# Patient Record
Sex: Female | Born: 2000
Health system: Southern US, Community
[De-identification: ages and names within clinical notes are randomized; demographics above are authoritative.]

## PROBLEM LIST (undated history)

## (undated) ENCOUNTER — Emergency Department (HOSPITAL_COMMUNITY): Admission: EM | Payer: 59 | Source: Home / Self Care

## (undated) DIAGNOSIS — G90A Postural orthostatic tachycardia syndrome (POTS): Secondary | ICD-10-CM

## (undated) DIAGNOSIS — G901 Familial dysautonomia [Riley-Day]: Secondary | ICD-10-CM

## (undated) DIAGNOSIS — I498 Other specified cardiac arrhythmias: Secondary | ICD-10-CM

## (undated) DIAGNOSIS — J45909 Unspecified asthma, uncomplicated: Secondary | ICD-10-CM

## (undated) DIAGNOSIS — F419 Anxiety disorder, unspecified: Secondary | ICD-10-CM

## (undated) DIAGNOSIS — F909 Attention-deficit hyperactivity disorder, unspecified type: Secondary | ICD-10-CM

## (undated) HISTORY — DX: Anxiety disorder, unspecified: F41.9

## (undated) HISTORY — DX: Attention-deficit hyperactivity disorder, unspecified type: F90.9

## (undated) HISTORY — PX: WISDOM TOOTH EXTRACTION: SHX21

---
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
## 2021-01-31 DIAGNOSIS — G901 Familial dysautonomia [Riley-Day]: Secondary | ICD-10-CM | POA: Insufficient documentation

## 2021-02-03 ENCOUNTER — Encounter: Payer: Self-pay | Admitting: Internal Medicine

## 2021-02-03 ENCOUNTER — Ambulatory Visit: Payer: BC Managed Care – PPO | Admitting: Internal Medicine

## 2021-02-03 ENCOUNTER — Other Ambulatory Visit: Payer: Self-pay

## 2021-02-03 VITALS — Ht 72.0 in | Wt 178.2 lb

## 2021-02-03 DIAGNOSIS — G901 Familial dysautonomia [Riley-Day]: Secondary | ICD-10-CM

## 2021-02-03 DIAGNOSIS — R002 Palpitations: Secondary | ICD-10-CM

## 2021-02-03 MED ORDER — ATENOLOL 25 MG PO TABS: ORAL_TABLET | ORAL | 3 refills | Status: DC

## 2021-02-03 NOTE — Progress Notes (Signed)
Patient Care Team: Creola Corn, MD as PCP - General (Internal Medicine) Meriam Sprague, MD as PCP - Cardiology (Cardiology)   HPI  Mary Townsend is a 20 y.o. female who goes by Mary Townsend Seen in follow-up for dysautonomic symptoms in the setting of arachnodactyly and anxiety.  Long history of post exercise lightheadedness and shower intolerance.  Worsened over the summer and then again 9/21 with COVID  with exacerbations of her dysautonomia Including worsening shower intolerances lightheadedness.  Working on salt water repletion and at last visit gave her beta-blockers for tachypalpitations particularly but not solely with exertion.  Seems also to be fueled by anxiety as she noted her heart rate going up on her apple watch     She continues to study at [employer] and made the Dean's List.   However, about a month ago she had Mono. She is recovering and reports not being back to baseline. Generally she was feeling well before becoming ill. Her POTS has been exacerbated by having Mono. Her heart rate  noticeably elevated and she could feel the palpitations. She continues to have lightheadedness getting out of bed, and does not tolerate showers well. Her school work has been mostly affected by lower energy and anxiety.  On her Apple watch her heart rate has been as high as 120 bpm while sitting. She notes she only wears her watch once a week or less because wearing it produces anxiety concerning her heart rate. When she is feeling severely anxious she will take hydroxyzine as needed.  She exercises at the gym regularly. This includes lifting weights, walks on the incline and StairMaster machines withh post exercise LH . Compared to September she is exercising the same amount.     About 2 weeks ago she was started on trazodone to help her sleep, which has been very helpful.  The patient denies chest pain, nocturnal dyspnea, orthopnea or peripheral edema.  There has been no  syncope.  DATE TEST EF   9/21 Echo  55-60 %          Date Cr K Hgb  5/22 0.72 3.7 13.0         Records and Results Reviewed   Past Medical History:  Diagnosis Date   Anxiety    Asthma    COVID 10/2019   Dysautonomia (HCC)    POTS (postural orthostatic tachycardia syndrome)     Past Surgical History:  Procedure Laterality Date   WISDOM TOOTH EXTRACTION      Current Meds  Medication Sig   atenolol (TENORMIN) 25 MG tablet Take 1 tablet (25 mg total) by mouth 2 (two) times daily.   clindamycin (CLEOCIN) 300 MG capsule Take 1 capsule (300 mg total) by mouth 3 (three) times daily.   gabapentin (NEURONTIN) 600 MG tablet Take 600 mg by mouth 3 (three) times daily.    hydrOXYzine (VISTARIL) 25 MG capsule Take 50 mg by mouth 2 (two) times daily as needed.   ibuprofen (ADVIL) 600 MG tablet Take 1 tablet (600 mg total) by mouth every 6 (six) hours as needed.   venlafaxine XR (EFFEXOR-XR) 150 MG 24 hr capsule Take 150 mg by mouth daily.   venlafaxine XR (EFFEXOR-XR) 75 MG 24 hr capsule Take 1 capsule by mouth daily.    Allergies  Allergen Reactions   Amoxicillin Hives      Review of Systems negative except from HPI and PMH  Physical Exam Ht 6' (1.829 m)    Wt  178 lb 3.2 oz (80.8 kg)    SpO2 98%    BMI 24.17 kg/m  Well developed and nourished in no acute distress HENT normal Neck supple with JVP-  flat  Clear Regular rate and rhythm, no murmurs or gallops Abd-soft with active BS No Clubbing cyanosis edema Skin-warm and dry A & Oriented  Grossly normal sensory and motor function  ECG sinus at 84 Intervals 17/09/35      CrCl cannot be calculated (Patient's most recent lab result is older than the maximum 21 days allowed.).   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   Most bothersome thing has been the palpitations as well as the fatigue.  She is recently started on trazodone which is helping her sleep and hopefully will help her fatigue.  We will try to increase her atenolol from 25 twice daily--50/25.  She will let us know next week or so how she is feeling.  She did a great job with her academics.  Encouraged salt water repletion and exercise.        I,Mathew Stumpf,acting as a scribe for Sherryl Manges, MD.,have documented all relevant documentation on the behalf of Sherryl Manges, MD,as directed by  Sherryl Manges, MD while in the presence of Sherryl Manges, MD.  I, Sherryl Manges, MD, have reviewed all documentation for this visit. The documentation on 02/03/21 for the exam, diagnosis, procedures, and orders are all accurate and complete.

## 2021-02-03 NOTE — Patient Instructions (Signed)
Medication Instructions:  Your physician has recommended you make the following change in your medication:  Increase your Atenolol to 50mg  - (2 tablets by mouth in the am) and 25mg  (1 tablet by mouth in the pm.)  *If you need a refill on your cardiac medications before your next appointment, please call your pharmacy*   Lab Work: None ordered.  If you have labs (blood work) drawn today and your tests are completely normal, you will receive your results only by: MyChart Message (if you have MyChart) OR A paper copy in the mail If you have any lab test that is abnormal or we need to change your treatment, we will call you to review the results.   Testing/Procedures: None ordered.    Follow-Up: At [facility], you and your health needs are our priority.  As part of our continuing mission to provide you with exceptional heart care, we have created designated Provider Care Teams.  These Care Teams include your primary Cardiologist (physician) and Advanced Practice Providers (APPs -  Physician Assistants and Nurse Practitioners) who all work together to provide you with the care you need, when you need it.  We recommend signing up for the patient portal called "MyChart".  Sign up information is provided on this After Visit Summary.  MyChart is used to connect with patients for Virtual Visits (Telemedicine).  Patients are able to view lab/test results, encounter notes, upcoming appointments, etc.  Non-urgent messages can be sent to your provider as well.   To learn more about what you can do with MyChart, go to .    Your next appointment:   6 months with Dr [facility]

## 2021-02-06 DIAGNOSIS — N644 Mastodynia: Secondary | ICD-10-CM | POA: Diagnosis not present

## 2021-02-06 DIAGNOSIS — N898 Other specified noninflammatory disorders of vagina: Secondary | ICD-10-CM | POA: Diagnosis not present

## 2021-02-06 DIAGNOSIS — R300 Dysuria: Secondary | ICD-10-CM | POA: Diagnosis not present

## 2021-02-07 ENCOUNTER — Telehealth: Payer: Self-pay | Admitting: Cardiology

## 2021-02-07 MED ORDER — ATENOLOL 25 MG PO TABS: ORAL_TABLET | ORAL | 3 refills | Status: AC

## 2021-02-07 NOTE — Telephone Encounter (Signed)
°*  STAT* If patient is at the pharmacy, call can be transferred to refill team.   1. Which medications need to be refilled? (please list name of each medication and dose if known) atenolol (TENORMIN) 25 MG tablet  2. Which pharmacy/location (including street and city if local pharmacy) is medication to be sent to?[facility] - [place], [place] - [address]   3. Do they need a 30 day or 90 day supply? 90 ds

## 2021-02-27 DIAGNOSIS — Z880 Allergy status to penicillin: Secondary | ICD-10-CM | POA: Diagnosis not present

## 2021-02-27 DIAGNOSIS — R42 Dizziness and giddiness: Secondary | ICD-10-CM | POA: Diagnosis not present

## 2021-02-27 DIAGNOSIS — R410 Disorientation, unspecified: Secondary | ICD-10-CM | POA: Diagnosis not present

## 2021-02-27 DIAGNOSIS — F419 Anxiety disorder, unspecified: Secondary | ICD-10-CM | POA: Diagnosis not present

## 2021-03-03 DIAGNOSIS — I498 Other specified cardiac arrhythmias: Secondary | ICD-10-CM | POA: Diagnosis not present

## 2021-05-09 DIAGNOSIS — M79602 Pain in left arm: Secondary | ICD-10-CM | POA: Diagnosis not present

## 2021-05-09 DIAGNOSIS — R0789 Other chest pain: Secondary | ICD-10-CM | POA: Diagnosis not present

## 2021-05-09 DIAGNOSIS — Z79899 Other long term (current) drug therapy: Secondary | ICD-10-CM | POA: Diagnosis not present

## 2021-05-09 DIAGNOSIS — R079 Chest pain, unspecified: Secondary | ICD-10-CM | POA: Diagnosis not present

## 2021-05-09 DIAGNOSIS — F419 Anxiety disorder, unspecified: Secondary | ICD-10-CM | POA: Diagnosis not present

## 2021-05-11 DIAGNOSIS — W01198A Fall on same level from slipping, tripping and stumbling with subsequent striking against other object, initial encounter: Secondary | ICD-10-CM | POA: Diagnosis not present

## 2021-05-11 DIAGNOSIS — M542 Cervicalgia: Secondary | ICD-10-CM | POA: Diagnosis not present

## 2021-05-12 DIAGNOSIS — M9901 Segmental and somatic dysfunction of cervical region: Secondary | ICD-10-CM | POA: Diagnosis not present

## 2021-05-12 DIAGNOSIS — M5412 Radiculopathy, cervical region: Secondary | ICD-10-CM | POA: Diagnosis not present

## 2021-05-13 DIAGNOSIS — M9901 Segmental and somatic dysfunction of cervical region: Secondary | ICD-10-CM | POA: Diagnosis not present

## 2021-05-13 DIAGNOSIS — M5412 Radiculopathy, cervical region: Secondary | ICD-10-CM | POA: Diagnosis not present

## 2021-05-31 DIAGNOSIS — R509 Fever, unspecified: Secondary | ICD-10-CM | POA: Diagnosis not present

## 2021-05-31 DIAGNOSIS — J029 Acute pharyngitis, unspecified: Secondary | ICD-10-CM | POA: Diagnosis not present

## 2021-05-31 DIAGNOSIS — Z8709 Personal history of other diseases of the respiratory system: Secondary | ICD-10-CM | POA: Diagnosis not present

## 2021-05-31 DIAGNOSIS — J069 Acute upper respiratory infection, unspecified: Secondary | ICD-10-CM | POA: Diagnosis not present

## 2021-06-08 ENCOUNTER — Encounter: Payer: Self-pay | Admitting: Internal Medicine

## 2021-06-08 ENCOUNTER — Ambulatory Visit: Payer: BC Managed Care – PPO | Admitting: Internal Medicine

## 2021-06-08 VITALS — BP 110/64 | HR 64 | Ht 72.0 in | Wt 179.0 lb

## 2021-06-08 DIAGNOSIS — G901 Familial dysautonomia [Riley-Day]: Secondary | ICD-10-CM | POA: Diagnosis not present

## 2021-06-08 NOTE — Progress Notes (Signed)
? ? ? ? ?Patient Care Team: ?Creola Corn, MD as PCP - General (Internal Medicine) ?Meriam Sprague, MD as PCP - Cardiology (Cardiology) ? ? ?HPI ? ?Mary Townsend is a 21 y.o. female who goes by HOWELL ?Seen in follow-up for dysautonomic symptoms in the setting of arachnodactyly and anxiety. ? ?Long history of post exercise lightheadedness and shower intolerance.  Worsened over the summer and then again 9/21 with COVID.  Developed recurrent COVID12/21 with exacerbations of her dysautonomia ?Including worsening shower intolerances lightheadedness.  Working on salt water repletion and at last visit gave her beta-blockers for tachypalpitations particularly but not solely with exertion.  Seems also to be fueled by anxiety as she noted her heart rate going up on her apple watch ? ?Tolerating the atenolol 25 daily is just going up to 50 mg a day.  Palpitations are better ? ? ?She had a great first year at [employer].  She made the Dean's list.  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  Some problems with dizziness.  Worse at the time of her periods she is disinclined towards hormonal suppression. ? ?Her anxiety is much better, depression is better. ? ?She and her mother have asked to have a referral to [facility] ? ?Records and Results Reviewed  ? ?Past Medical History:  ?Diagnosis Date  ? Anxiety   ? Asthma   ? COVID 10/2019  ? Dysautonomia (HCC)   ? POTS (postural orthostatic tachycardia syndrome)   ? ? ?Past Surgical History:  ?Procedure Laterality Date  ? WISDOM TOOTH EXTRACTION    ? ? ?Current Meds  ?Medication Sig  ? ALPRAZolam (XANAX) 0.25 MG tablet Take 0.125-0.25 mg by mouth daily as needed.  ? atenolol (TENORMIN) 25 MG tablet Take 50mg  (2 tablets) by mouth in the am and 25mg  (1 tablet) by mouth in the pm  ? clindamycin (CLEOCIN) 300 MG capsule Take 1 capsule (300 mg total) by mouth 3 (three) times daily.  ? gabapentin (NEURONTIN) 600 MG tablet Take 600 mg by mouth 3 (three) times daily.   ? hydrOXYzine  (VISTARIL) 25 MG capsule Take 50 mg by mouth 2 (two) times daily as needed.  ? sertraline (ZOLOFT) 100 MG tablet Take 100 mg by mouth daily.  ? traZODone (DESYREL) 50 MG tablet Take 25 mg by mouth at bedtime.  ? ? ?Allergies  ?Allergen Reactions  ? Amoxicillin Hives  ? ? ? ? ?Review of Systems negative except from HPI and PMH ? ?Physical Exam ?BP 110/64   Pulse 64   Ht 6' (1.829 m)   Wt 179 lb (81.2 kg)   SpO2 98%   BMI 24.28 kg/m?  ?Well developed and nourished in no acute distress ?HENT normal ?Neck supple with JVP-  flat   ?Clear ?Regular rate and rhythm, no murmurs or gallops ?Abd-soft with active BS ?No Clubbing cyanosis edema ?Skin-warm and dry ?A & Oriented  Grossly normal sensory and motor function ? ?ECG sinus @ 64 ?17/08/36   ? ?   ? ?CrCl cannot be calculated (Patient's most recent lab result is older than the maximum 21 days allowed.). ? ? ?Assessment and  Plan ?Dysautonomia with post Covid aggravation and repeat COVID ?  ?Anxiety ?  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  ?  ?Overall doing exceptionally well.  Handled the stresses of school with grade aplumb.  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  Thinks she will handle the heat okay. ?Anxiety is better. ? ?She would  like to have a referral to [facility].  We will look into this. ? ?

## 2021-06-08 NOTE — Addendum Note (Signed)
Addended by: Alois Cliche on: 06/08/2021 05:38 PM ? ? Modules accepted: Orders ? ?

## 2021-06-08 NOTE — Patient Instructions (Signed)
Medication Instructions:  Your physician recommends that you continue on your current medications as directed. Please refer to the Current Medication list given to you today.  *If you need a refill on your cardiac medications before your next appointment, please call your pharmacy*   Lab Work: None ordered.  If you have labs (blood work) drawn today and your tests are completely normal, you will receive your results only by: MyChart Message (if you have MyChart) OR A paper copy in the mail If you have any lab test that is abnormal or we need to change your treatment, we will call you to review the results.   Testing/Procedures: None ordered.    Follow-Up: At [facility], you and your health needs are our priority.  As part of our continuing mission to provide you with exceptional heart care, we have created designated Provider Care Teams.  These Care Teams include your primary Cardiologist (physician) and Advanced Practice Providers (APPs -  Physician Assistants and Nurse Practitioners) who all work together to provide you with the care you need, when you need it.  We recommend signing up for the patient portal called "MyChart".  Sign up information is provided on this After Visit Summary.  MyChart is used to connect with patients for Virtual Visits (Telemedicine).  Patients are able to view lab/test results, encounter notes, upcoming appointments, etc.  Non-urgent messages can be sent to your provider as well.   To learn more about what you can do with MyChart, go to [email or web address].    Your next appointment:    6 months with Dr Klein   Important Information About Sugar       

## 2021-07-16 ENCOUNTER — Encounter: Payer: Self-pay | Admitting: Internal Medicine

## 2021-08-02 DIAGNOSIS — R569 Unspecified convulsions: Secondary | ICD-10-CM | POA: Diagnosis not present

## 2021-08-02 DIAGNOSIS — R519 Headache, unspecified: Secondary | ICD-10-CM | POA: Diagnosis not present

## 2021-08-02 DIAGNOSIS — I10 Essential (primary) hypertension: Secondary | ICD-10-CM | POA: Diagnosis not present

## 2021-08-22 DIAGNOSIS — H9192 Unspecified hearing loss, left ear: Secondary | ICD-10-CM | POA: Diagnosis not present

## 2021-08-22 DIAGNOSIS — H7292 Unspecified perforation of tympanic membrane, left ear: Secondary | ICD-10-CM | POA: Diagnosis not present

## 2021-08-29 DIAGNOSIS — Z130 Encounter for screening for diseases of the blood and blood-forming organs and certain disorders involving the immune mechanism: Secondary | ICD-10-CM | POA: Diagnosis not present

## 2021-08-29 DIAGNOSIS — Z113 Encounter for screening for infections with a predominantly sexual mode of transmission: Secondary | ICD-10-CM | POA: Diagnosis not present

## 2021-08-29 DIAGNOSIS — Z01419 Encounter for gynecological examination (general) (routine) without abnormal findings: Secondary | ICD-10-CM | POA: Diagnosis not present

## 2021-08-29 DIAGNOSIS — Z1389 Encounter for screening for other disorder: Secondary | ICD-10-CM | POA: Diagnosis not present

## 2021-09-07 DIAGNOSIS — R7989 Other specified abnormal findings of blood chemistry: Secondary | ICD-10-CM | POA: Diagnosis not present

## 2021-09-07 DIAGNOSIS — E538 Deficiency of other specified B group vitamins: Secondary | ICD-10-CM | POA: Diagnosis not present

## 2021-09-07 DIAGNOSIS — F419 Anxiety disorder, unspecified: Secondary | ICD-10-CM | POA: Diagnosis not present

## 2021-09-18 DIAGNOSIS — Z1389 Encounter for screening for other disorder: Secondary | ICD-10-CM | POA: Diagnosis not present

## 2021-09-18 DIAGNOSIS — R82998 Other abnormal findings in urine: Secondary | ICD-10-CM | POA: Diagnosis not present

## 2021-09-18 DIAGNOSIS — Z1331 Encounter for screening for depression: Secondary | ICD-10-CM | POA: Diagnosis not present

## 2021-09-18 DIAGNOSIS — I498 Other specified cardiac arrhythmias: Secondary | ICD-10-CM | POA: Diagnosis not present

## 2021-09-18 DIAGNOSIS — Z0000 Encounter for general adult medical examination without abnormal findings: Secondary | ICD-10-CM | POA: Diagnosis not present

## 2021-09-19 DIAGNOSIS — H73891 Other specified disorders of tympanic membrane, right ear: Secondary | ICD-10-CM | POA: Diagnosis not present

## 2021-09-19 DIAGNOSIS — H6122 Impacted cerumen, left ear: Secondary | ICD-10-CM | POA: Diagnosis not present

## 2021-09-19 DIAGNOSIS — Z8669 Personal history of other diseases of the nervous system and sense organs: Secondary | ICD-10-CM | POA: Diagnosis not present

## 2021-10-19 DIAGNOSIS — Z20822 Contact with and (suspected) exposure to covid-19: Secondary | ICD-10-CM | POA: Diagnosis not present

## 2021-10-19 DIAGNOSIS — R52 Pain, unspecified: Secondary | ICD-10-CM | POA: Diagnosis not present

## 2021-10-19 DIAGNOSIS — R0981 Nasal congestion: Secondary | ICD-10-CM | POA: Diagnosis not present

## 2021-10-19 DIAGNOSIS — J069 Acute upper respiratory infection, unspecified: Secondary | ICD-10-CM | POA: Diagnosis not present

## 2021-11-16 DIAGNOSIS — F509 Eating disorder, unspecified: Secondary | ICD-10-CM | POA: Diagnosis not present

## 2021-11-16 DIAGNOSIS — Z79899 Other long term (current) drug therapy: Secondary | ICD-10-CM | POA: Diagnosis not present

## 2021-11-25 DIAGNOSIS — R300 Dysuria: Secondary | ICD-10-CM | POA: Diagnosis not present

## 2021-11-25 DIAGNOSIS — N898 Other specified noninflammatory disorders of vagina: Secondary | ICD-10-CM | POA: Diagnosis not present

## 2021-12-06 DIAGNOSIS — Z23 Encounter for immunization: Secondary | ICD-10-CM | POA: Diagnosis not present

## 2021-12-19 DIAGNOSIS — J069 Acute upper respiratory infection, unspecified: Secondary | ICD-10-CM | POA: Diagnosis not present

## 2021-12-29 DIAGNOSIS — B2790 Infectious mononucleosis, unspecified without complication: Secondary | ICD-10-CM | POA: Diagnosis not present

## 2021-12-29 DIAGNOSIS — J029 Acute pharyngitis, unspecified: Secondary | ICD-10-CM | POA: Diagnosis not present

## 2022-01-25 DIAGNOSIS — I498 Other specified cardiac arrhythmias: Secondary | ICD-10-CM | POA: Diagnosis not present

## 2022-01-25 DIAGNOSIS — Z1331 Encounter for screening for depression: Secondary | ICD-10-CM | POA: Diagnosis not present

## 2022-02-16 DIAGNOSIS — Z3202 Encounter for pregnancy test, result negative: Secondary | ICD-10-CM | POA: Diagnosis not present

## 2022-02-16 DIAGNOSIS — N12 Tubulo-interstitial nephritis, not specified as acute or chronic: Secondary | ICD-10-CM | POA: Diagnosis not present

## 2022-02-16 DIAGNOSIS — N3090 Cystitis, unspecified without hematuria: Secondary | ICD-10-CM | POA: Diagnosis not present

## 2022-02-16 DIAGNOSIS — M549 Dorsalgia, unspecified: Secondary | ICD-10-CM | POA: Diagnosis not present

## 2022-02-16 DIAGNOSIS — Z79899 Other long term (current) drug therapy: Secondary | ICD-10-CM | POA: Diagnosis not present

## 2022-02-16 DIAGNOSIS — R509 Fever, unspecified: Secondary | ICD-10-CM | POA: Diagnosis not present

## 2022-02-16 DIAGNOSIS — R1031 Right lower quadrant pain: Secondary | ICD-10-CM | POA: Diagnosis not present

## 2022-02-16 DIAGNOSIS — Z20822 Contact with and (suspected) exposure to covid-19: Secondary | ICD-10-CM | POA: Diagnosis not present

## 2022-03-23 DIAGNOSIS — M545 Low back pain, unspecified: Secondary | ICD-10-CM | POA: Diagnosis not present

## 2022-03-23 DIAGNOSIS — R102 Pelvic and perineal pain: Secondary | ICD-10-CM | POA: Diagnosis not present

## 2022-04-09 DIAGNOSIS — F419 Anxiety disorder, unspecified: Secondary | ICD-10-CM | POA: Diagnosis not present

## 2022-04-09 DIAGNOSIS — Z8659 Personal history of other mental and behavioral disorders: Secondary | ICD-10-CM | POA: Diagnosis not present

## 2022-04-09 DIAGNOSIS — R635 Abnormal weight gain: Secondary | ICD-10-CM | POA: Diagnosis not present

## 2022-04-28 DIAGNOSIS — R059 Cough, unspecified: Secondary | ICD-10-CM | POA: Diagnosis not present

## 2022-04-28 DIAGNOSIS — R5383 Other fatigue: Secondary | ICD-10-CM | POA: Diagnosis not present

## 2022-04-28 DIAGNOSIS — J208 Acute bronchitis due to other specified organisms: Secondary | ICD-10-CM | POA: Diagnosis not present

## 2022-05-21 DIAGNOSIS — M79671 Pain in right foot: Secondary | ICD-10-CM | POA: Diagnosis not present

## 2022-05-21 DIAGNOSIS — S90851A Superficial foreign body, right foot, initial encounter: Secondary | ICD-10-CM | POA: Diagnosis not present

## 2022-05-22 DIAGNOSIS — M79671 Pain in right foot: Secondary | ICD-10-CM | POA: Diagnosis not present

## 2022-05-22 DIAGNOSIS — S90851A Superficial foreign body, right foot, initial encounter: Secondary | ICD-10-CM | POA: Diagnosis not present

## 2022-05-23 DIAGNOSIS — M79671 Pain in right foot: Secondary | ICD-10-CM | POA: Diagnosis not present

## 2022-05-23 DIAGNOSIS — S90851S Superficial foreign body, right foot, sequela: Secondary | ICD-10-CM | POA: Diagnosis not present

## 2022-06-08 DIAGNOSIS — F419 Anxiety disorder, unspecified: Secondary | ICD-10-CM | POA: Diagnosis not present

## 2022-06-08 DIAGNOSIS — R0981 Nasal congestion: Secondary | ICD-10-CM | POA: Diagnosis not present

## 2022-06-08 DIAGNOSIS — J302 Other seasonal allergic rhinitis: Secondary | ICD-10-CM | POA: Diagnosis not present

## 2022-06-08 DIAGNOSIS — J45909 Unspecified asthma, uncomplicated: Secondary | ICD-10-CM | POA: Diagnosis not present

## 2022-06-28 ENCOUNTER — Ambulatory Visit: Payer: BC Managed Care – PPO | Admitting: Podiatry

## 2022-06-28 ENCOUNTER — Encounter: Payer: Self-pay | Admitting: Podiatry

## 2022-06-28 ENCOUNTER — Ambulatory Visit (INDEPENDENT_AMBULATORY_CARE_PROVIDER_SITE_OTHER): Payer: BC Managed Care – PPO

## 2022-06-28 DIAGNOSIS — L923 Foreign body granuloma of the skin and subcutaneous tissue: Secondary | ICD-10-CM | POA: Diagnosis not present

## 2022-06-28 DIAGNOSIS — M7751 Other enthesopathy of right foot: Secondary | ICD-10-CM

## 2022-06-28 DIAGNOSIS — M79671 Pain in right foot: Secondary | ICD-10-CM

## 2022-06-29 NOTE — Progress Notes (Signed)
Subjective:   Patient ID: Mary Townsend, female   DOB: 22 y.o.   MRN: [id number]   HPI Patient presents with quite a bit of inflammation in the right foot where she stepped on a piece of glass around a month ago.  She did have it worked on and they thought they remove the glass but the area has been sore and it is not actually hurting completely right on the spot but more diffuse around the mask metatarsal phalangeal joints 234.  Patient does not smoke likes to be active   Review of Systems  All other systems reviewed and are negative.       Objective:  Physical Exam Vitals and nursing note reviewed.  Constitutional:      Appearance: She is well-developed.  Pulmonary:     Effort: Pulmonary effort is normal.  Musculoskeletal:        General: Normal range of motion.  Skin:    General: Skin is warm.  Neurological:     Mental Status: She is alert.     Neurovascular status found to be intact muscle strength found to be adequate range of motion within normal limits.  Patient is noted to have inflammation and pain around the second third fourth MPJs and a small area where there was a obvious small incision made with removal of foreign body.  It is crusted over there is no erythema edema or drainage associated with it     Assessment:  Probability that there is no glass but cannot be ruled out with strong possibility that an inflammatory complex is developed secondary to the injury     Plan:  H&P reviewed at great length x-rays taken inconclusive.  I did open the area up I may have been able to get out a small bit of foreign body but nothing of significance and I flushed the area applied sterile dressing with cushioning.  I then applied air fracture walker to remove all plantar pressure on the foot and I would like to put her on a steroid to try to reduce the inflammation but she is having test done this weekend so I want a wait till those are completed on Monday.  Also I would include  an antibiotic in case there is any kind of subtle infection process not identifiable.  Patient is also going out to the country on June 9 so we have a short timetable to try to improve her condition  X-rays indicate that there is no signs currently of foreign body or other bone or soft tissue pathology

## 2022-07-05 ENCOUNTER — Encounter: Payer: Self-pay | Admitting: Podiatry

## 2022-07-05 MED ORDER — METHYLPREDNISOLONE 4 MG PO TBPK: ORAL_TABLET | ORAL | 0 refills | Status: AC

## 2022-07-06 ENCOUNTER — Other Ambulatory Visit: Payer: Self-pay | Admitting: Podiatry

## 2022-07-06 DIAGNOSIS — G90A Postural orthostatic tachycardia syndrome (POTS): Secondary | ICD-10-CM | POA: Diagnosis not present

## 2022-07-06 DIAGNOSIS — M7751 Other enthesopathy of right foot: Secondary | ICD-10-CM

## 2022-07-06 DIAGNOSIS — M79671 Pain in right foot: Secondary | ICD-10-CM

## 2022-07-06 DIAGNOSIS — R232 Flushing: Secondary | ICD-10-CM | POA: Diagnosis not present

## 2022-07-06 DIAGNOSIS — L923 Foreign body granuloma of the skin and subcutaneous tissue: Secondary | ICD-10-CM

## 2022-07-06 DIAGNOSIS — G909 Disorder of the autonomic nervous system, unspecified: Secondary | ICD-10-CM | POA: Diagnosis not present

## 2022-07-06 DIAGNOSIS — R42 Dizziness and giddiness: Secondary | ICD-10-CM | POA: Diagnosis not present

## 2022-07-06 DIAGNOSIS — R000 Tachycardia, unspecified: Secondary | ICD-10-CM | POA: Diagnosis not present

## 2022-08-28 ENCOUNTER — Other Ambulatory Visit: Payer: Self-pay | Admitting: Podiatry

## 2022-08-28 ENCOUNTER — Encounter: Payer: Self-pay | Admitting: Podiatry

## 2022-08-28 MED ORDER — DOXYCYCLINE HYCLATE 100 MG PO TABS: 100.0000 mg | ORAL_TABLET | Freq: Two times a day (BID) | ORAL | 0 refills | Status: AC

## 2022-09-11 DIAGNOSIS — R635 Abnormal weight gain: Secondary | ICD-10-CM | POA: Diagnosis not present

## 2022-09-17 ENCOUNTER — Ambulatory Visit: Payer: BC Managed Care – PPO | Admitting: Podiatry

## 2022-09-19 DIAGNOSIS — Z124 Encounter for screening for malignant neoplasm of cervix: Secondary | ICD-10-CM | POA: Diagnosis not present

## 2022-09-19 DIAGNOSIS — R1032 Left lower quadrant pain: Secondary | ICD-10-CM | POA: Diagnosis not present

## 2022-09-19 DIAGNOSIS — Z01419 Encounter for gynecological examination (general) (routine) without abnormal findings: Secondary | ICD-10-CM | POA: Diagnosis not present

## 2022-09-24 ENCOUNTER — Ambulatory Visit: Payer: BC Managed Care – PPO | Admitting: Podiatry

## 2022-09-24 ENCOUNTER — Encounter: Payer: Self-pay | Admitting: Podiatry

## 2022-09-24 DIAGNOSIS — M722 Plantar fascial fibromatosis: Secondary | ICD-10-CM

## 2022-09-24 MED ORDER — TRIAMCINOLONE ACETONIDE 10 MG/ML IJ SUSP
10.0000 mg | Freq: Once | INTRAMUSCULAR | Status: AC
Start: 2022-09-24 — End: 2022-09-24
  Administered 2022-09-24: 10 mg via INTRA_ARTICULAR

## 2022-09-24 MED ORDER — DICLOFENAC SODIUM 75 MG PO TBEC: 75.0000 mg | DELAYED_RELEASE_TABLET | Freq: Two times a day (BID) | ORAL | 2 refills | Status: AC

## 2022-09-24 NOTE — Progress Notes (Signed)
Subjective:   Patient ID: Mary Townsend, female   DOB: 22 y.o.   MRN: [id number]   HPI Patient presents stating that she is having quite a bit of pain in her right heel and shin and the area that we worked on before is done well and patient is training to do a half marathon   ROS      Objective:  Physical Exam  Neurovascular status intact with patient having relative cavus foot structure with equinus condition and does have discomfort in the medial fascial band right at the insertional point tendon calcaneus with the patient noted to have shin pain along the dorsal shin     Assessment:  Appears to be mechanical related to inflammatory condition with patient being quite active     Plan:  H&P reviewed with her and father and at this point I did do sterile prep and I injected the plantar fascia at insertion 3 mg Kenalog 5 mg Xylocaine and we casted for orthotics to try to take pressure off the arch and stress.  Patient may require other treatments I also placed on an anti-inflammatory for the next several weeks of Voltaren twice daily.

## 2022-09-25 NOTE — Progress Notes (Signed)
Scanned Impressions today  Per Dr Charlsie Merles we can ship orthotics with break in schedule to patient at [facility]  Will need to get address as patient did not know at time of appointment  Addison Bailey Cped, CFo, CFm

## 2022-09-28 ENCOUNTER — Encounter: Payer: Self-pay | Admitting: Podiatry

## 2022-10-15 ENCOUNTER — Encounter: Payer: Self-pay | Admitting: Podiatry

## 2022-10-25 DIAGNOSIS — R0981 Nasal congestion: Secondary | ICD-10-CM | POA: Diagnosis not present

## 2022-11-22 DIAGNOSIS — R079 Chest pain, unspecified: Secondary | ICD-10-CM | POA: Diagnosis not present

## 2022-11-22 DIAGNOSIS — Z79899 Other long term (current) drug therapy: Secondary | ICD-10-CM | POA: Diagnosis not present

## 2022-11-22 DIAGNOSIS — R0781 Pleurodynia: Secondary | ICD-10-CM | POA: Diagnosis not present

## 2022-11-22 DIAGNOSIS — M940 Chondrocostal junction syndrome [Tietze]: Secondary | ICD-10-CM | POA: Diagnosis not present

## 2022-11-22 DIAGNOSIS — R0602 Shortness of breath: Secondary | ICD-10-CM | POA: Diagnosis not present

## 2022-11-22 DIAGNOSIS — R0789 Other chest pain: Secondary | ICD-10-CM | POA: Diagnosis not present

## 2022-11-22 DIAGNOSIS — Z23 Encounter for immunization: Secondary | ICD-10-CM | POA: Diagnosis not present

## 2022-11-23 DIAGNOSIS — R079 Chest pain, unspecified: Secondary | ICD-10-CM | POA: Diagnosis not present

## 2022-12-25 DIAGNOSIS — R350 Frequency of micturition: Secondary | ICD-10-CM | POA: Diagnosis not present

## 2022-12-25 DIAGNOSIS — R1012 Left upper quadrant pain: Secondary | ICD-10-CM | POA: Diagnosis not present

## 2022-12-25 DIAGNOSIS — R1032 Left lower quadrant pain: Secondary | ICD-10-CM | POA: Diagnosis not present

## 2023-02-11 DIAGNOSIS — K5900 Constipation, unspecified: Secondary | ICD-10-CM | POA: Diagnosis not present

## 2023-03-28 DIAGNOSIS — J069 Acute upper respiratory infection, unspecified: Secondary | ICD-10-CM | POA: Diagnosis not present

## 2023-03-28 DIAGNOSIS — R110 Nausea: Secondary | ICD-10-CM | POA: Diagnosis not present

## 2023-06-05 DIAGNOSIS — R197 Diarrhea, unspecified: Secondary | ICD-10-CM | POA: Diagnosis not present

## 2023-06-05 DIAGNOSIS — R42 Dizziness and giddiness: Secondary | ICD-10-CM | POA: Diagnosis not present

## 2023-06-05 DIAGNOSIS — G43909 Migraine, unspecified, not intractable, without status migrainosus: Secondary | ICD-10-CM | POA: Diagnosis not present

## 2023-06-05 DIAGNOSIS — R519 Headache, unspecified: Secondary | ICD-10-CM | POA: Diagnosis not present

## 2023-06-17 DIAGNOSIS — I498 Other specified cardiac arrhythmias: Secondary | ICD-10-CM | POA: Diagnosis not present

## 2023-06-20 ENCOUNTER — Encounter: Attending: Family | Primary: Sports Medicine

## 2023-06-20 NOTE — Progress Notes (Deleted)
 New patient presents today for a routine gynecological examination with no complaints.    OB History    No obstetric history on file.           GYN History           No LMP recorded. Cycle Length 28 Lasting 7  negative dysmenorrhea; negative postcoital bleeding    Past Medical History:  Past Medical History:   Diagnosis Date    Dysautonomia (HCC)        Past Surgical History:  No past surgical history on file.    Allergies:   Allergies   Allergen Reactions    Amoxicillin Rash       Medication History:  Current Outpatient Medications   Medication Sig Dispense Refill    levETIRAcetam  (KEPPRA ) 500 MG tablet Take 1 tablet by mouth 2 times daily 60 tablet 0    atenolol (TENORMIN) 25 MG tablet Take 25 mg by mouth in the morning and at bedtime      venlafaxine (EFFEXOR) 100 MG tablet Take 250 mg by mouth once      gabapentin (NEURONTIN) 100 MG capsule Take 250 mg by mouth once.      hydrOXYzine HCl (ATARAX) 25 MG tablet Take 25 mg by mouth 3 times daily as needed for Anxiety       No current facility-administered medications for this visit.       Social History:  Social History     Socioeconomic History    Marital status: Single     Spouse name: Not on file    Number of children: Not on file    Years of education: Not on file    Highest education level: Not on file   Occupational History    Not on file   Tobacco Use    Smoking status: Never    Smokeless tobacco: Never   Substance and Sexual Activity    Alcohol use: Yes    Drug use: Never    Sexual activity: Not on file   Other Topics Concern    Not on file   Social History Narrative    Not on file     Social Drivers of Health     Financial Resource Strain: Not on file   Food Insecurity: Not on file   Transportation Needs: Not on file   Physical Activity: Not on file   Stress: Not on file   Social Connections: Not on file   Intimate Partner Violence: Not on file   Housing Stability: Not on file       Family History:  No family history on file.    Review of Systems - General  ROS: negative except for that discussed in HPI      ROS:  Feeling well. No dyspnea or chest pain on exertion.  No abdominal pain, change in bowel habits, black or bloody stools.  No urinary tract symptoms. No neurological complaints.    Objective:   There were no vitals taken for this visit.  The patient appears well, alert, oriented x 3, in no distress.  ENT normal.  Neck supple. No adenopathy or thyromegaly.   Lungs:  clear, good air entry, no wheezes, rhonchi or rales.   Heart:  S1 and S2 normal, no murmurs, regular rate and rhythm.  Abdomen:  soft without tenderness, guarding, mass or organomegaly.   Extremities show no edema, normal peripheral pulses.   Neurological is normal, no focal findings.    BREAST EXAM: {pe  breast exam:315056::"breasts appear normal, no suspicious masses, no skin or nipple changes or axillary nodes"}    PELVIC EXAM: {pelvic exam:315900::"normal external genitalia, vulva, vagina, cervix, uterus and adnexa"}    Female chaperone present for exam    Assessment/Plan:     There are no diagnoses linked to this encounter.     {gyn plan:315269::"mammogram","pap smear","return annually or prn"}    Supervising physician is Dr. Metro Acron.

## 2023-08-31 ENCOUNTER — Emergency Department: Admit: 2023-08-31 | Payer: BLUE CROSS/BLUE SHIELD | Primary: Sports Medicine

## 2023-08-31 DIAGNOSIS — R7982 Elevated C-reactive protein (CRP): Secondary | ICD-10-CM | POA: Diagnosis not present

## 2023-08-31 DIAGNOSIS — R509 Fever, unspecified: Secondary | ICD-10-CM | POA: Diagnosis not present

## 2023-08-31 DIAGNOSIS — R197 Diarrhea, unspecified: Secondary | ICD-10-CM | POA: Diagnosis not present

## 2023-08-31 DIAGNOSIS — K529 Noninfective gastroenteritis and colitis, unspecified: Secondary | ICD-10-CM | POA: Diagnosis not present

## 2023-08-31 DIAGNOSIS — R109 Unspecified abdominal pain: Secondary | ICD-10-CM | POA: Diagnosis not present

## 2023-08-31 DIAGNOSIS — R5081 Fever presenting with conditions classified elsewhere: Secondary | ICD-10-CM | POA: Diagnosis not present

## 2023-08-31 LAB — COMPREHENSIVE METABOLIC PANEL
ALT: 16 U/L (ref 8–45)
AST: 35 U/L (ref 15–37)
Albumin/Globulin Ratio: 1 (ref 1.0–1.9)
Albumin: 3.3 g/dL — ABNORMAL LOW (ref 3.5–5.0)
Alk Phosphatase: 51 U/L (ref 35–104)
Anion Gap: 12 mmol/L (ref 7–16)
BUN: 8 mg/dL (ref 6–23)
CO2: 21 mmol/L (ref 20–29)
Calcium: 8.9 mg/dL (ref 8.8–10.2)
Chloride: 103 mmol/L (ref 98–107)
Creatinine: 0.72 mg/dL (ref 0.60–1.10)
Est, Glom Filt Rate: >90 ml/min/1.73m2 (ref >60)
Globulin: 3.3 g/dL (ref 2.3–3.5)
Glucose: 113 mg/dL — ABNORMAL HIGH (ref 70–99)
Sodium: 136 mmol/L (ref 136–145)
Total Bilirubin: 0.4 mg/dL (ref 0.0–1.2)
Total Protein: 6.6 g/dL (ref 6.3–8.2)

## 2023-08-31 LAB — CBC WITH AUTO DIFFERENTIAL
Basophils %: 0.3 % (ref 0.0–2.0)
Basophils Absolute: 0.02 K/UL (ref 0.00–0.20)
Eosinophils %: 0 % — ABNORMAL LOW (ref 0.5–7.8)
Eosinophils Absolute: 0 K/UL (ref 0.00–0.80)
Hematocrit: 37.8 % (ref 35.8–46.3)
Hemoglobin: 12.8 g/dL (ref 11.7–15.4)
Immature Granulocytes %: 1 % (ref 0.0–5.0)
Immature Granulocytes Absolute: 0.07 K/UL (ref 0.0–0.5)
Lymphocytes %: 11.1 % — ABNORMAL LOW (ref 13.0–44.0)
Lymphocytes Absolute: 0.8 K/UL (ref 0.50–4.60)
MCH: 30.9 pg (ref 26.1–32.9)
MCHC: 33.9 g/dL (ref 31.4–35.0)
MCV: 91.3 FL (ref 82–102)
MPV: 11 FL (ref 9.4–12.3)
Monocytes %: 4 % (ref 4.0–12.0)
Monocytes Absolute: 0.29 K/UL (ref 0.10–1.30)
Neutrophils %: 83.6 % — ABNORMAL HIGH (ref 43.0–78.0)
Neutrophils Absolute: 6.05 K/UL (ref 1.70–8.20)
Platelets: 163 K/uL (ref 150–450)
RBC: 4.14 M/uL (ref 4.05–5.2)
RDW: 13.2 % (ref 11.9–14.6)
WBC: 7.2 K/uL (ref 4.3–11.1)
nRBC: 0 K/uL (ref 0.0–0.2)

## 2023-08-31 LAB — POC CHEM 8
Anion Gap, POC: 9.1 mmol/L
POC BUN: 7 mg/dL — ABNORMAL LOW (ref 8–26)
POC Chloride: 107 mmol/L (ref 98–107)
POC Creatinine: 0.65 mg/dL — ABNORMAL LOW (ref 0.8–1.5)
POC Glucose: 96 mg/dL (ref 65–100)
POC Potassium: 4.3 mmol/L (ref 3.5–5.1)
POC Sodium: 142 mmol/L (ref 136–145)
POC TCO2: 25.9 mmol/L (ref 21–32)
eGFR, POC: >90 ml/min/1.73m2 (ref >60)

## 2023-08-31 LAB — HIV 1/2 AG/AB, 4TH GENERATION,W RFLX CONFIRM: HIV 1/2 Interp: NONREACTIVE

## 2023-08-31 LAB — LACTATE, SEPSIS: Lactic Acid, Sepsis: 1 mmol/L (ref 0.5–2.0)

## 2023-08-31 LAB — HIGH SENSITIVITY CRP: CRP, High Sensitivity: 162 mg/L — ABNORMAL HIGH (ref 0.0–3.0)

## 2023-08-31 LAB — C. DIFFICILE TOXIN MOLECULAR: C. difficile toxin Molecular: NEGATIVE

## 2023-08-31 LAB — LIPASE: Lipase: 13 U/L (ref 13–60)

## 2023-08-31 LAB — PROCALCITONIN: Procalcitonin: 0.23 ng/mL — ABNORMAL HIGH (ref 0.00–0.10)

## 2023-08-31 MED ORDER — HYOSCYAMINE SULFATE 0.125 MG SL SUBL
0.125 | SUBLINGUAL | Status: AC
Start: 2023-08-31 — End: 2023-08-31
  Administered 2023-08-31: 20:00:00 0.25 mg via SUBLINGUAL

## 2023-08-31 MED ORDER — ONDANSETRON HCL 4 MG/2ML IJ SOLN
4 | INTRAMUSCULAR | Status: AC
Start: 2023-08-31 — End: 2023-08-31
  Administered 2023-08-31: 20:00:00 4 mg via INTRAVENOUS

## 2023-08-31 MED ORDER — METRONIDAZOLE 500 MG PO TABS
500 | ORAL_TABLET | Freq: Three times a day (TID) | ORAL | 0 refills | 7.00000 days | Status: AC
Start: 2023-08-31 — End: 2023-09-06

## 2023-08-31 MED ORDER — SODIUM CHLORIDE 0.9 % IV BOLUS
0.9 | INTRAVENOUS | Status: AC
Start: 2023-08-31 — End: 2023-08-31
  Administered 2023-08-31: 20:00:00 1000 mL via INTRAVENOUS

## 2023-08-31 MED ORDER — IOPAMIDOL 76 % IV SOLN
76 | Freq: Once | INTRAVENOUS | Status: AC | PRN
Start: 2023-08-31 — End: 2023-08-31
  Administered 2023-08-31: 21:00:00 100 mL via INTRAVENOUS

## 2023-08-31 MED ORDER — CEFTRIAXONE SODIUM 1 G IJ SOLR
1 | INTRAMUSCULAR | Status: AC
Start: 2023-08-31 — End: 2023-08-31
  Administered 2023-08-31: 1000 mg via INTRAVENOUS

## 2023-08-31 MED ORDER — ONDANSETRON 8 MG PO TBDP
8 | ORAL_TABLET | Freq: Three times a day (TID) | ORAL | 0 refills | 6.00000 days | Status: AC | PRN
Start: 2023-08-31 — End: 2023-09-04

## 2023-08-31 MED ORDER — CEFPODOXIME PROXETIL 200 MG PO TABS
200 | ORAL_TABLET | Freq: Two times a day (BID) | ORAL | 0 refills | 9.00000 days | Status: AC
Start: 2023-08-31 — End: 2023-09-07

## 2023-08-31 MED ORDER — METRONIDAZOLE 500 MG PO TABS
500 | ORAL | Status: AC
Start: 2023-08-31 — End: 2023-08-31
  Administered 2023-08-31: 500 mg via ORAL

## 2023-08-31 MED FILL — CEFTRIAXONE SODIUM 1 G IJ SOLR: 1 g | INTRAMUSCULAR | Qty: 1000 | Fill #0

## 2023-08-31 MED FILL — METRONIDAZOLE 500 MG PO TABS: 500 mg | ORAL | Qty: 1 | Fill #0

## 2023-08-31 MED FILL — HYOSCYAMINE SULFATE 0.125 MG SL SUBL: 0.125 mg | SUBLINGUAL | Qty: 1 | Fill #0

## 2023-08-31 MED FILL — ONDANSETRON HCL 4 MG/2ML IJ SOLN: 4 MG/2ML | INTRAMUSCULAR | Qty: 2 | Fill #0

## 2023-08-31 NOTE — ED Provider Notes (Signed)
 Emergency Department Provider Signout / Continuation of Care Note         DISPOSITION         ICD-10-CM    1. Diarrhea of presumed infectious origin  R19.7       2. Fever, unspecified fever cause  R50.9       3. Elevated C-reactive protein (CRP)  R79.82       4. Colitis  K52.9           The patient's care was signed out to me at shift change.      Final Plan      Patient signed out to me pending further assessment and management.  CT scanning shows fairly significant amount of colitis, in the context of the patient reported symptoms of diarrhea, CRP greater than 150, reported fever at home, patient now reporting that a friend who is also on the trip just tested positive for Salmonella.  As such given significantly elevated CRP, CT findings, reports of fever, do feel it appropriate to manage with antibiotics at this time.  Received one-time dose IV Rocephin  here in the emergency department as well as p.o. Flagyl .  Outpatient prescriptions for Vantin  as well as Flagyl , Zofran  written.  Recommend OTC Tylenol/ibuprofen as needed for fevers, abdominal pain type symptoms.  Recommended rest, aggressive p.o. fluid hydration, return as needed.  I recommended close monitoring of symptoms, low threshold to return as needed.  Patient/family given instructions to return as needed for any questions, concerns or worsening symptoms, particularly those as outlined in the disposition section / discharge section of patient discharge paperwork. Patient/family verbalizes understanding and agreement with ED course/plan in shared medical decision making. Questions answered.           Margaree Barter, MD  09/10/23 [phone]

## 2023-08-31 NOTE — ED Triage Notes (Signed)
 Patient arrived with a travelers diarrhea complaint. Fever since Thursday and diarrhea since yesterday. Patient took PeptoB. And tylenol an hour ago. Patient also feels weak and disoriented.

## 2023-08-31 NOTE — ED Provider Notes (Signed)
 Emergency Department Provider Note       [facility] EMERGENCY DEPT   PCP: Not, On File (Inactive)   Age: 23 y.o.   Sex: female     DISPOSITION    No diagnosis found.    Medical Decision Making     Given recent travel outside the country will try to obtain stool studies.  Will obtain screening labs here.  Treat symptomatically    5:09 PM EDT  White blood cell count and hemoglobin is stable.  However patient does have a significant elevated CRP.  Therefore I will obtain a CT scan of abdomen and pelvis.  Patient has been unable to produce a stool specimen at this time.    5:10 PM EDT The patient's care will be transitioned to Dr. Margaree.  At this time, the plan is CT and reassessment.  Please see that provider's documentation for further information.         1 acute illness with systemic symptoms.  Shared medical decision making was utilized in creating the patients health plan today.  I independently ordered and reviewed each unique test.    I reviewed external records: ED visit note from a different ED.   I reviewed external records: provider visit note from PCP.  I reviewed external records: provider visit note from outside specialist.  I reviewed external records: previous EKG including cardiologist interpretation.    I reviewed external records: previous lab results from outside ED.  I reviewed external records: previous imaging study including radiologist interpretation.                     History     Patient with recent travel outside the country presents to the ER complaining of fever diarrhea abdominal pain and nausea.  Patient reports recently traveling to [place].  On Thursday she started with fever.  Progressed to include abdominal pain nausea with some vomiting and diarrhea.  States the diarrhea has been fairly persistent.  Denies any blood in her stool or blood in her vomit.  Reports intermittent crampy abdominal pain.  Denies any cough.  Denies any rhinorrhea congestion    The history is provided by the  patient.   Diarrhea  Quality:  Watery  Severity:  Moderate  Onset quality:  Gradual  Duration:  3 days  Relieved by:  Nothing  Worsened by:  Nothing  Associated symptoms: chills, fever and myalgias        ROS     Review of Systems   Constitutional:  Positive for chills and fever.   HENT:  Negative for congestion, dental problem, trouble swallowing and voice change.    Eyes:  Negative for redness and visual disturbance.   Respiratory:  Negative for choking, chest tightness, shortness of breath and wheezing.    Cardiovascular:  Negative for chest pain and leg swelling.   Gastrointestinal:  Positive for diarrhea.   Endocrine: Negative for polydipsia, polyphagia and polyuria.   Genitourinary:  Negative for urgency.   Musculoskeletal:  Positive for myalgias.   Skin:  Negative for color change and pallor.   Neurological:  Negative for light-headedness and numbness.   Hematological:  Negative for adenopathy. Does not bruise/bleed easily.   Psychiatric/Behavioral:  Negative for behavioral problems and confusion.    All other systems reviewed and are negative.       Physical Exam     Vitals signs and nursing note reviewed:  Vitals:    08/31/23 1427 08/31/23 1430   BP:  111/65    Pulse: (!) 102    Resp: 17    Temp: 98.1 F (36.7 C)    SpO2: 97%    Weight:  79.8 kg (176 lb)   Height:  1.803 m (5' 11)      Physical Exam  Vitals and nursing note reviewed.   Constitutional:       Appearance: Normal appearance.   HENT:      Head: Normocephalic and atraumatic.      Right Ear: External ear normal.   Eyes:      General:         Right eye: No discharge.         Left eye: No discharge.      Extraocular Movements: Extraocular movements intact.      Pupils: Pupils are equal, round, and reactive to light.   Cardiovascular:      Rate and Rhythm: Regular rhythm. Tachycardia present.      Pulses: Normal pulses.      Heart sounds: Normal heart sounds.   Pulmonary:      Effort: Pulmonary effort is normal.      Breath sounds: Normal breath  sounds.   Abdominal:      General: Abdomen is flat. There is no distension.      Palpations: Abdomen is soft. There is no mass.      Tenderness: There is abdominal tenderness.   Musculoskeletal:         General: No tenderness.      Left lower leg: No edema.   Skin:     Coloration: Skin is not jaundiced or pale.      Findings: No erythema.   Neurological:      General: No focal deficit present.      Mental Status: She is alert and oriented to person, place, and time.   Psychiatric:         Mood and Affect: Mood normal.         Behavior: Behavior normal.        Procedures     Procedures    Orders placed during this emergency department visit:     Orders Placed This Encounter   Procedures    Ova and Parasite Examination    Fecal leukocytes    Culture, Campylobacter, Stool    C. difficile toxin Molecular    CBC with Auto Differential    CMP    Lactate, Sepsis    Lipase    Procalcitonin    High sensitivity CRP        Medications given during this emergency department visit:   Medications - No data to display    New prescriptions:     New Prescriptions    No medications on file        Past History and Complexity:     Past Medical History:   Diagnosis Date    Dysautonomia (HCC)         No past surgical history on file.     Social History     Socioeconomic History    Marital status: Single   Tobacco Use    Smoking status: Never    Smokeless tobacco: Never   Substance and Sexual Activity    Alcohol use: Yes    Drug use: Never     Social Drivers of Health     Intimate Partner Violence: Low Risk  (07/06/2022)    Received from [facility]    [facility] Historical  Interpersonal Safety     Does anyone neglect, hurt, or threaten the patient?: No        Previous Medications    ATENOLOL (TENORMIN) 25 MG TABLET    Take 25 mg by mouth in the morning and at bedtime    GABAPENTIN (NEURONTIN) 100 MG CAPSULE    Take 250 mg by mouth once.    HYDROXYZINE HCL (ATARAX) 25 MG TABLET    Take 25 mg by mouth 3 times daily as  needed for Anxiety    LEVETIRACETAM  (KEPPRA ) 500 MG TABLET    Take 1 tablet by mouth 2 times daily    VENLAFAXINE (EFFEXOR) 100 MG TABLET    Take 250 mg by mouth once        Results from this emergency department visit:      No results found for any visits on 08/31/23.      No orders to display                No results for input(s): COVID19 in the last 72 hours.     Voice dictation software was used during the making of this note.  This software is not perfect and grammatical and other typographical errors may be present.  This note has not been completely proofread for errors.     Delores Bernardino KIDD, MD  08/31/23 [phone]

## 2023-08-31 NOTE — Discharge Instructions (Signed)
 Return as needed for any questions concerns or worsening symptoms particular increasing/unremitting abdominal pain, recurrent vomiting, inability keep fluids down by mouth, recurrent fever 100.4 or greater, lethargy, ill appearance.

## 2023-09-01 DIAGNOSIS — Z3202 Encounter for pregnancy test, result negative: Secondary | ICD-10-CM | POA: Diagnosis not present

## 2023-09-01 DIAGNOSIS — R197 Diarrhea, unspecified: Secondary | ICD-10-CM | POA: Diagnosis not present

## 2023-09-01 DIAGNOSIS — R42 Dizziness and giddiness: Secondary | ICD-10-CM | POA: Diagnosis not present

## 2023-09-01 DIAGNOSIS — R109 Unspecified abdominal pain: Secondary | ICD-10-CM | POA: Diagnosis not present

## 2023-09-01 LAB — ENTERIC BACT PANEL, DNA
CAMPYLOBACTER SPECIES, DNA: NOT DETECTED
ENTEROTOXIGEN E COLI, DNA: NOT DETECTED
P. SHIGELLOIDES, DNA: NOT DETECTED
SALMONELLA SPECIES, DNA: DETECTED — AB
SHIGA TOXIN PRODUCING, DNA: NOT DETECTED
SHIGELLA SPECIES, DNA: NOT DETECTED
VIBRIO SPECIES, DNA: NOT DETECTED
Y. ENTEROCOLITICA, DNA: NOT DETECTED

## 2023-09-01 LAB — ENTERIC PARASITE PANEL, PCR
Cryptosporidium PCR: NOT DETECTED
Entamoeba Histolytica PCR: NOT DETECTED
Giardia Lamblia PCR: NOT DETECTED

## 2023-09-03 DIAGNOSIS — R112 Nausea with vomiting, unspecified: Secondary | ICD-10-CM | POA: Diagnosis not present

## 2023-09-03 DIAGNOSIS — R197 Diarrhea, unspecified: Secondary | ICD-10-CM | POA: Diagnosis not present

## 2023-09-03 DIAGNOSIS — R509 Fever, unspecified: Secondary | ICD-10-CM | POA: Diagnosis not present

## 2023-09-03 DIAGNOSIS — Z79899 Other long term (current) drug therapy: Secondary | ICD-10-CM | POA: Diagnosis not present

## 2023-09-03 DIAGNOSIS — E875 Hyperkalemia: Secondary | ICD-10-CM | POA: Diagnosis not present

## 2023-09-03 DIAGNOSIS — A020 Salmonella enteritis: Secondary | ICD-10-CM | POA: Diagnosis not present

## 2023-09-05 LAB — FECAL LEUKOCYTES, REFLEX

## 2023-09-05 LAB — FECAL LEUKOCYTES

## 2023-09-23 DIAGNOSIS — L700 Acne vulgaris: Secondary | ICD-10-CM | POA: Diagnosis not present

## 2023-09-23 DIAGNOSIS — L810 Postinflammatory hyperpigmentation: Secondary | ICD-10-CM | POA: Diagnosis not present

## 2023-09-24 DIAGNOSIS — Z124 Encounter for screening for malignant neoplasm of cervix: Secondary | ICD-10-CM | POA: Diagnosis not present

## 2023-09-24 DIAGNOSIS — Z113 Encounter for screening for infections with a predominantly sexual mode of transmission: Secondary | ICD-10-CM | POA: Diagnosis not present

## 2023-09-24 DIAGNOSIS — Z01419 Encounter for gynecological examination (general) (routine) without abnormal findings: Secondary | ICD-10-CM | POA: Diagnosis not present

## 2023-09-24 DIAGNOSIS — Z130 Encounter for screening for diseases of the blood and blood-forming organs and certain disorders involving the immune mechanism: Secondary | ICD-10-CM | POA: Diagnosis not present

## 2023-11-05 DIAGNOSIS — F411 Generalized anxiety disorder: Secondary | ICD-10-CM | POA: Diagnosis not present

## 2023-11-05 DIAGNOSIS — F4310 Post-traumatic stress disorder, unspecified: Secondary | ICD-10-CM | POA: Diagnosis not present

## 2023-11-05 DIAGNOSIS — F332 Major depressive disorder, recurrent severe without psychotic features: Secondary | ICD-10-CM | POA: Diagnosis not present

## 2023-11-05 DIAGNOSIS — F422 Mixed obsessional thoughts and acts: Secondary | ICD-10-CM | POA: Diagnosis not present

## 2024-01-10 DIAGNOSIS — G90A Postural orthostatic tachycardia syndrome (POTS): Secondary | ICD-10-CM | POA: Diagnosis not present

## 2024-01-10 DIAGNOSIS — F411 Generalized anxiety disorder: Secondary | ICD-10-CM | POA: Diagnosis not present

## 2024-01-10 DIAGNOSIS — F410 Panic disorder [episodic paroxysmal anxiety] without agoraphobia: Secondary | ICD-10-CM | POA: Diagnosis not present

## 2024-01-10 DIAGNOSIS — F332 Major depressive disorder, recurrent severe without psychotic features: Secondary | ICD-10-CM | POA: Diagnosis not present

## 2024-01-16 DIAGNOSIS — R42 Dizziness and giddiness: Secondary | ICD-10-CM | POA: Diagnosis not present

## 2024-01-16 DIAGNOSIS — M79604 Pain in right leg: Secondary | ICD-10-CM | POA: Diagnosis not present

## 2024-01-16 DIAGNOSIS — E860 Dehydration: Secondary | ICD-10-CM | POA: Diagnosis not present

## 2024-01-16 DIAGNOSIS — F419 Anxiety disorder, unspecified: Secondary | ICD-10-CM | POA: Diagnosis not present

## 2024-01-16 DIAGNOSIS — R519 Headache, unspecified: Secondary | ICD-10-CM | POA: Diagnosis not present

## 2024-01-16 DIAGNOSIS — M79605 Pain in left leg: Secondary | ICD-10-CM | POA: Diagnosis not present

## 2024-01-16 DIAGNOSIS — R000 Tachycardia, unspecified: Secondary | ICD-10-CM | POA: Diagnosis not present

## 2024-01-16 DIAGNOSIS — G90A Postural orthostatic tachycardia syndrome (POTS): Secondary | ICD-10-CM | POA: Diagnosis not present

## 2024-01-17 DIAGNOSIS — F411 Generalized anxiety disorder: Secondary | ICD-10-CM | POA: Diagnosis not present

## 2024-01-17 DIAGNOSIS — F410 Panic disorder [episodic paroxysmal anxiety] without agoraphobia: Secondary | ICD-10-CM | POA: Diagnosis not present

## 2024-01-17 DIAGNOSIS — G44209 Tension-type headache, unspecified, not intractable: Secondary | ICD-10-CM | POA: Diagnosis not present

## 2024-01-17 DIAGNOSIS — G90A Postural orthostatic tachycardia syndrome (POTS): Secondary | ICD-10-CM | POA: Diagnosis not present

## 2024-01-17 DIAGNOSIS — R0981 Nasal congestion: Secondary | ICD-10-CM | POA: Diagnosis not present
